# Patient Record
Sex: Male | Born: 2005 | Race: White | Hispanic: No | Marital: Single | Smoking: Never smoker
Health system: Southern US, Community
[De-identification: ages and names within clinical notes are randomized; demographics above are authoritative.]

## PROBLEM LIST (undated history)

## (undated) DIAGNOSIS — E119 Type 2 diabetes mellitus without complications: Secondary | ICD-10-CM

---
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
## 2024-01-09 DIAGNOSIS — H5789 Other specified disorders of eye and adnexa: Secondary | ICD-10-CM | POA: Diagnosis not present

## 2024-01-09 DIAGNOSIS — S0502XA Injury of conjunctiva and corneal abrasion without foreign body, left eye, initial encounter: Secondary | ICD-10-CM | POA: Diagnosis not present

## 2024-02-18 DIAGNOSIS — S61321A Laceration with foreign body of left index finger with damage to nail, initial encounter: Secondary | ICD-10-CM | POA: Diagnosis not present

## 2024-02-18 DIAGNOSIS — E1065 Type 1 diabetes mellitus with hyperglycemia: Secondary | ICD-10-CM | POA: Diagnosis not present

## 2024-02-18 DIAGNOSIS — W260XXA Contact with knife, initial encounter: Secondary | ICD-10-CM | POA: Diagnosis not present

## 2024-05-05 ENCOUNTER — Telehealth: Payer: Self-pay | Admitting: "Endocrinology

## 2024-05-05 NOTE — Telephone Encounter (Signed)
 Patient's mom called about his referral. I called her back to let her know we do not have one and to call his peds endocrinology back to have them fax it. Her cell is [phone]

## 2024-07-01 NOTE — Patient Instructions (Incomplete)

## 2024-07-06 ENCOUNTER — Ambulatory Visit: Admitting: Nurse Practitioner

## 2024-07-06 DIAGNOSIS — Z794 Long term (current) use of insulin: Secondary | ICD-10-CM

## 2024-07-06 DIAGNOSIS — E1065 Type 1 diabetes mellitus with hyperglycemia: Secondary | ICD-10-CM

## 2024-07-06 DIAGNOSIS — Z7985 Long-term (current) use of injectable non-insulin antidiabetic drugs: Secondary | ICD-10-CM

## 2024-07-06 DIAGNOSIS — E782 Mixed hyperlipidemia: Secondary | ICD-10-CM

## 2024-08-18 ENCOUNTER — Ambulatory Visit: Admitting: Nurse Practitioner
